# Patient Record
Sex: Female | Born: 2000 | Hispanic: Yes | Marital: Single | Smoking: Never smoker
Health system: Southern US, Community
[De-identification: ages and names within clinical notes are randomized; demographics above are authoritative.]

## PROBLEM LIST (undated history)

## (undated) DIAGNOSIS — E669 Obesity, unspecified: Secondary | ICD-10-CM

---
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
## 2021-01-14 DIAGNOSIS — L2089 Other atopic dermatitis: Secondary | ICD-10-CM | POA: Diagnosis not present

## 2021-01-31 DIAGNOSIS — Z419 Encounter for procedure for purposes other than remedying health state, unspecified: Secondary | ICD-10-CM | POA: Diagnosis not present

## 2021-03-03 DIAGNOSIS — Z419 Encounter for procedure for purposes other than remedying health state, unspecified: Secondary | ICD-10-CM | POA: Diagnosis not present

## 2021-04-02 DIAGNOSIS — Z419 Encounter for procedure for purposes other than remedying health state, unspecified: Secondary | ICD-10-CM | POA: Diagnosis not present

## 2021-04-11 DIAGNOSIS — M40292 Other kyphosis, cervical region: Secondary | ICD-10-CM | POA: Diagnosis not present

## 2021-04-11 DIAGNOSIS — Z68.41 Body mass index (BMI) pediatric, greater than or equal to 95th percentile for age: Secondary | ICD-10-CM | POA: Diagnosis not present

## 2021-04-11 DIAGNOSIS — Z713 Dietary counseling and surveillance: Secondary | ICD-10-CM | POA: Diagnosis not present

## 2021-04-11 DIAGNOSIS — Z0000 Encounter for general adult medical examination without abnormal findings: Secondary | ICD-10-CM | POA: Diagnosis not present

## 2021-05-03 DIAGNOSIS — Z419 Encounter for procedure for purposes other than remedying health state, unspecified: Secondary | ICD-10-CM | POA: Diagnosis not present

## 2021-06-02 DIAGNOSIS — Z419 Encounter for procedure for purposes other than remedying health state, unspecified: Secondary | ICD-10-CM | POA: Diagnosis not present

## 2021-07-03 DIAGNOSIS — Z419 Encounter for procedure for purposes other than remedying health state, unspecified: Secondary | ICD-10-CM | POA: Diagnosis not present

## 2021-08-03 DIAGNOSIS — Z419 Encounter for procedure for purposes other than remedying health state, unspecified: Secondary | ICD-10-CM | POA: Diagnosis not present

## 2021-08-31 DIAGNOSIS — Z419 Encounter for procedure for purposes other than remedying health state, unspecified: Secondary | ICD-10-CM | POA: Diagnosis not present

## 2021-10-01 DIAGNOSIS — Z419 Encounter for procedure for purposes other than remedying health state, unspecified: Secondary | ICD-10-CM | POA: Diagnosis not present

## 2021-10-31 DIAGNOSIS — Z419 Encounter for procedure for purposes other than remedying health state, unspecified: Secondary | ICD-10-CM | POA: Diagnosis not present

## 2021-12-01 DIAGNOSIS — Z419 Encounter for procedure for purposes other than remedying health state, unspecified: Secondary | ICD-10-CM | POA: Diagnosis not present

## 2021-12-31 DIAGNOSIS — Z419 Encounter for procedure for purposes other than remedying health state, unspecified: Secondary | ICD-10-CM | POA: Diagnosis not present

## 2022-01-31 DIAGNOSIS — Z419 Encounter for procedure for purposes other than remedying health state, unspecified: Secondary | ICD-10-CM | POA: Diagnosis not present

## 2022-03-03 DIAGNOSIS — Z419 Encounter for procedure for purposes other than remedying health state, unspecified: Secondary | ICD-10-CM | POA: Diagnosis not present

## 2022-04-02 DIAGNOSIS — Z419 Encounter for procedure for purposes other than remedying health state, unspecified: Secondary | ICD-10-CM | POA: Diagnosis not present

## 2022-05-03 DIAGNOSIS — Z419 Encounter for procedure for purposes other than remedying health state, unspecified: Secondary | ICD-10-CM | POA: Diagnosis not present

## 2022-06-02 DIAGNOSIS — Z419 Encounter for procedure for purposes other than remedying health state, unspecified: Secondary | ICD-10-CM | POA: Diagnosis not present

## 2022-07-03 DIAGNOSIS — Z419 Encounter for procedure for purposes other than remedying health state, unspecified: Secondary | ICD-10-CM | POA: Diagnosis not present

## 2022-08-03 DIAGNOSIS — Z419 Encounter for procedure for purposes other than remedying health state, unspecified: Secondary | ICD-10-CM | POA: Diagnosis not present

## 2022-09-01 DIAGNOSIS — Z419 Encounter for procedure for purposes other than remedying health state, unspecified: Secondary | ICD-10-CM | POA: Diagnosis not present

## 2022-09-11 DIAGNOSIS — Z0000 Encounter for general adult medical examination without abnormal findings: Secondary | ICD-10-CM | POA: Diagnosis not present

## 2022-09-11 DIAGNOSIS — Z6836 Body mass index (BMI) 36.0-36.9, adult: Secondary | ICD-10-CM | POA: Diagnosis not present

## 2022-09-11 DIAGNOSIS — Z713 Dietary counseling and surveillance: Secondary | ICD-10-CM | POA: Diagnosis not present

## 2022-09-11 DIAGNOSIS — Z1330 Encounter for screening examination for mental health and behavioral disorders, unspecified: Secondary | ICD-10-CM | POA: Diagnosis not present

## 2022-10-02 DIAGNOSIS — Z419 Encounter for procedure for purposes other than remedying health state, unspecified: Secondary | ICD-10-CM | POA: Diagnosis not present

## 2022-11-01 DIAGNOSIS — Z419 Encounter for procedure for purposes other than remedying health state, unspecified: Secondary | ICD-10-CM | POA: Diagnosis not present

## 2022-12-02 DIAGNOSIS — Z419 Encounter for procedure for purposes other than remedying health state, unspecified: Secondary | ICD-10-CM | POA: Diagnosis not present

## 2023-01-01 DIAGNOSIS — Z419 Encounter for procedure for purposes other than remedying health state, unspecified: Secondary | ICD-10-CM | POA: Diagnosis not present

## 2023-02-01 DIAGNOSIS — Z419 Encounter for procedure for purposes other than remedying health state, unspecified: Secondary | ICD-10-CM | POA: Diagnosis not present

## 2023-02-04 DIAGNOSIS — J069 Acute upper respiratory infection, unspecified: Secondary | ICD-10-CM | POA: Diagnosis not present

## 2023-02-04 DIAGNOSIS — U071 COVID-19: Secondary | ICD-10-CM | POA: Diagnosis not present

## 2023-03-04 DIAGNOSIS — Z419 Encounter for procedure for purposes other than remedying health state, unspecified: Secondary | ICD-10-CM | POA: Diagnosis not present

## 2023-04-03 DIAGNOSIS — Z419 Encounter for procedure for purposes other than remedying health state, unspecified: Secondary | ICD-10-CM | POA: Diagnosis not present

## 2023-05-04 DIAGNOSIS — Z419 Encounter for procedure for purposes other than remedying health state, unspecified: Secondary | ICD-10-CM | POA: Diagnosis not present

## 2023-06-03 DIAGNOSIS — Z419 Encounter for procedure for purposes other than remedying health state, unspecified: Secondary | ICD-10-CM | POA: Diagnosis not present

## 2023-07-04 DIAGNOSIS — Z419 Encounter for procedure for purposes other than remedying health state, unspecified: Secondary | ICD-10-CM | POA: Diagnosis not present

## 2023-08-04 DIAGNOSIS — Z419 Encounter for procedure for purposes other than remedying health state, unspecified: Secondary | ICD-10-CM | POA: Diagnosis not present

## 2023-09-01 DIAGNOSIS — Z419 Encounter for procedure for purposes other than remedying health state, unspecified: Secondary | ICD-10-CM | POA: Diagnosis not present

## 2023-10-13 DIAGNOSIS — Z419 Encounter for procedure for purposes other than remedying health state, unspecified: Secondary | ICD-10-CM | POA: Diagnosis not present

## 2023-10-17 DIAGNOSIS — J189 Pneumonia, unspecified organism: Secondary | ICD-10-CM | POA: Diagnosis not present

## 2023-10-17 DIAGNOSIS — R000 Tachycardia, unspecified: Secondary | ICD-10-CM | POA: Diagnosis not present

## 2023-10-17 DIAGNOSIS — R0600 Dyspnea, unspecified: Secondary | ICD-10-CM | POA: Diagnosis not present

## 2023-10-17 DIAGNOSIS — R918 Other nonspecific abnormal finding of lung field: Secondary | ICD-10-CM | POA: Diagnosis not present

## 2023-10-17 DIAGNOSIS — B348 Other viral infections of unspecified site: Secondary | ICD-10-CM | POA: Diagnosis not present

## 2023-10-17 DIAGNOSIS — J45909 Unspecified asthma, uncomplicated: Secondary | ICD-10-CM | POA: Diagnosis not present

## 2023-10-17 DIAGNOSIS — R0902 Hypoxemia: Secondary | ICD-10-CM | POA: Diagnosis not present

## 2023-10-17 DIAGNOSIS — Z1152 Encounter for screening for COVID-19: Secondary | ICD-10-CM | POA: Diagnosis not present

## 2023-10-18 DIAGNOSIS — R000 Tachycardia, unspecified: Secondary | ICD-10-CM | POA: Diagnosis not present

## 2023-10-18 DIAGNOSIS — R0902 Hypoxemia: Secondary | ICD-10-CM | POA: Diagnosis not present

## 2023-10-18 DIAGNOSIS — J1289 Other viral pneumonia: Secondary | ICD-10-CM | POA: Diagnosis not present

## 2023-10-18 DIAGNOSIS — J189 Pneumonia, unspecified organism: Secondary | ICD-10-CM | POA: Diagnosis not present

## 2023-10-18 DIAGNOSIS — B9789 Other viral agents as the cause of diseases classified elsewhere: Secondary | ICD-10-CM | POA: Diagnosis not present

## 2023-10-18 DIAGNOSIS — R9431 Abnormal electrocardiogram [ECG] [EKG]: Secondary | ICD-10-CM | POA: Diagnosis not present

## 2023-10-19 DIAGNOSIS — J189 Pneumonia, unspecified organism: Secondary | ICD-10-CM | POA: Diagnosis not present

## 2023-10-20 DIAGNOSIS — J189 Pneumonia, unspecified organism: Secondary | ICD-10-CM | POA: Diagnosis not present

## 2023-10-21 DIAGNOSIS — J189 Pneumonia, unspecified organism: Secondary | ICD-10-CM | POA: Diagnosis not present

## 2023-10-23 DIAGNOSIS — J189 Pneumonia, unspecified organism: Secondary | ICD-10-CM | POA: Diagnosis not present

## 2023-11-12 DIAGNOSIS — Z419 Encounter for procedure for purposes other than remedying health state, unspecified: Secondary | ICD-10-CM | POA: Diagnosis not present

## 2023-12-13 DIAGNOSIS — Z419 Encounter for procedure for purposes other than remedying health state, unspecified: Secondary | ICD-10-CM | POA: Diagnosis not present

## 2024-01-12 DIAGNOSIS — Z419 Encounter for procedure for purposes other than remedying health state, unspecified: Secondary | ICD-10-CM | POA: Diagnosis not present

## 2024-02-12 DIAGNOSIS — Z419 Encounter for procedure for purposes other than remedying health state, unspecified: Secondary | ICD-10-CM | POA: Diagnosis not present

## 2024-03-14 DIAGNOSIS — Z419 Encounter for procedure for purposes other than remedying health state, unspecified: Secondary | ICD-10-CM | POA: Diagnosis not present

## 2024-04-13 DIAGNOSIS — Z419 Encounter for procedure for purposes other than remedying health state, unspecified: Secondary | ICD-10-CM | POA: Diagnosis not present

## 2024-07-07 ENCOUNTER — Emergency Department
Admission: EM | Admit: 2024-07-07 | Discharge: 2024-07-07 | Disposition: A | Discharge status: Home / Self Care | Attending: Emergency Medicine | Admitting: Emergency Medicine

## 2024-07-07 ENCOUNTER — Other Ambulatory Visit: Payer: Self-pay

## 2024-07-07 ENCOUNTER — Emergency Department

## 2024-07-07 DIAGNOSIS — Y92410 Unspecified street and highway as the place of occurrence of the external cause: Secondary | ICD-10-CM | POA: Diagnosis not present

## 2024-07-07 DIAGNOSIS — S0083XA Contusion of other part of head, initial encounter: Secondary | ICD-10-CM | POA: Diagnosis not present

## 2024-07-07 DIAGNOSIS — S0990XA Unspecified injury of head, initial encounter: Secondary | ICD-10-CM | POA: Diagnosis present

## 2024-07-07 MED ORDER — NAPROXEN 500 MG PO TBEC: 500.0000 mg | DELAYED_RELEASE_TABLET | Freq: Two times a day (BID) | ORAL | 0 refills | Status: AC
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
